# Patient Record
Sex: Male | Born: 2000 | Race: Black or African American | Hispanic: No | Marital: Single | Smoking: Never smoker
Health system: Southern US, Community
[De-identification: ages and names within clinical notes are randomized; demographics above are authoritative.]

## PROBLEM LIST (undated history)

## (undated) HISTORY — PX: FRENULOPLASTY: SHX1684

## (undated) HISTORY — PX: TONSILLECTOMY AND ADENOIDECTOMY: SUR1326

---
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
## 2019-04-12 ENCOUNTER — Encounter (HOSPITAL_COMMUNITY): Payer: Self-pay | Admitting: Emergency Medicine

## 2019-04-12 ENCOUNTER — Emergency Department (HOSPITAL_COMMUNITY): Payer: BC Managed Care – PPO

## 2019-04-12 ENCOUNTER — Emergency Department (HOSPITAL_COMMUNITY)
Admission: EM | Admit: 2019-04-12 | Discharge: 2019-04-12 | Disposition: A | Discharge status: Home / Self Care | Payer: BC Managed Care – PPO | Attending: Emergency Medicine | Admitting: Emergency Medicine

## 2019-04-12 ENCOUNTER — Other Ambulatory Visit: Payer: Self-pay

## 2019-04-12 DIAGNOSIS — Y999 Unspecified external cause status: Secondary | ICD-10-CM | POA: Diagnosis not present

## 2019-04-12 DIAGNOSIS — Y9389 Activity, other specified: Secondary | ICD-10-CM | POA: Diagnosis not present

## 2019-04-12 DIAGNOSIS — W268XXA Contact with other sharp object(s), not elsewhere classified, initial encounter: Secondary | ICD-10-CM | POA: Insufficient documentation

## 2019-04-12 DIAGNOSIS — Y9289 Other specified places as the place of occurrence of the external cause: Secondary | ICD-10-CM | POA: Insufficient documentation

## 2019-04-12 DIAGNOSIS — S61217A Laceration without foreign body of left little finger without damage to nail, initial encounter: Secondary | ICD-10-CM

## 2019-04-12 DIAGNOSIS — Z23 Encounter for immunization: Secondary | ICD-10-CM | POA: Insufficient documentation

## 2019-04-12 MED ORDER — LIDOCAINE HCL (PF) 2 % IJ SOLN: 5.0000 mL | Freq: Once | INTRAMUSCULAR | Status: AC

## 2019-04-12 MED ORDER — TETANUS-DIPHTH-ACELL PERTUSSIS 5-2.5-18.5 LF-MCG/0.5 IM SUSP
0.5000 mL | Freq: Once | INTRAMUSCULAR | Status: AC
  Administered 2019-04-12: 0.5 mL via INTRAMUSCULAR
  Filled 2019-04-12: qty 0.5

## 2019-04-12 MED ORDER — IBUPROFEN 600 MG PO TABS: 600.0000 mg | ORAL_TABLET | Freq: Four times a day (QID) | ORAL | 0 refills | Status: AC | PRN

## 2019-04-12 MED ORDER — LIDOCAINE HCL (PF) 2 % IJ SOLN
INTRAMUSCULAR | Status: AC
  Filled 2019-04-12: qty 10

## 2019-04-12 MED ORDER — POVIDONE-IODINE 10 % EX SOLN
CUTANEOUS | Status: DC | PRN
  Filled 2019-04-12 (×2): qty 15

## 2019-04-12 MED ORDER — DOXYCYCLINE HYCLATE 100 MG PO CAPS: 100.0000 mg | ORAL_CAPSULE | Freq: Two times a day (BID) | ORAL | 0 refills | Status: DC

## 2019-04-12 MED ORDER — LIDOCAINE HCL (PF) 2 % IJ SOLN
INTRAMUSCULAR | Status: AC
  Administered 2019-04-12: 5 mL
  Filled 2019-04-12: qty 10

## 2019-04-12 NOTE — Discharge Instructions (Addendum)
Keep your wound clean and dry and covered.  Change the dressing once daily however and inspected to make sure it is not having increased redness, swelling or drainage of pus.  Take the entire course of the antibiotics prescribed.  Call Dr. Roney Mans for a recheck of your injury as outlined above.

## 2019-04-12 NOTE — ED Notes (Signed)
BETADINE SOAK

## 2019-04-12 NOTE — ED Triage Notes (Signed)
Injury to left pinkie finger on Saturday, rates pain 6/10.

## 2019-04-12 NOTE — ED Provider Notes (Signed)
[facility] EMERGENCY DEPARTMENT Provider Note   CSN: [id number] Arrival date & time: 04/12/19  1208     History Chief Complaint  Patient presents with  . Finger Injury    left pinkie    Nicholas Mooney is a 19 y.o. male, right handed, presenting with a laceration to his lift 5th finger occurring 2 days ago when he sliced it on a sharp edge of a metal pole.  He has tried to wash the wound but reports he had too much pain in it to do a good job.  He has kept it covered with a bandage, denies any other treatment.  His last tetanus was given 8 years ago.  He denies numbness in the finger, but has difficulty moving it due to pain.   HPI     History reviewed. No pertinent past medical history.  There are no problems to display for this patient.   History reviewed. No pertinent surgical history.     No family history on file.  Social History   Tobacco Use  . Smoking status: Never Smoker  . Smokeless tobacco: Never Used  Substance Use Topics  . Alcohol use: Never  . Drug use: Yes    Types: Marijuana    Home Medications Prior to Admission medications   Medication Sig Start Date End Date Taking? Authorizing Provider  doxycycline (VIBRAMYCIN) 100 MG capsule Take 1 capsule (100 mg total) by mouth 2 (two) times daily. 04/12/19   Evalee Jefferson, PA-C  ibuprofen (ADVIL) 600 MG tablet Take 1 tablet (600 mg total) by mouth every 6 (six) hours as needed. 04/12/19   Evalee Jefferson, PA-C    Allergies    Augmentin [amoxicillin-pot clavulanate]  Review of Systems   Review of Systems  Constitutional: Negative for chills and fever.  Musculoskeletal: Positive for arthralgias.  Skin: Positive for wound.  Neurological: Negative for weakness and numbness.  All other systems reviewed and are negative.   Physical Exam Updated Vital Signs BP 134/76   Pulse 64   Temp 99.1 F (37.3 C) (Oral)   Resp 20   Ht 6\' 2"  (1.88 m)   Wt 65.8 kg   SpO2 97%   BMI 18.62 kg/m   Physical  Exam Constitutional:      Appearance: He is well-developed.  HENT:     Head: Atraumatic.  Cardiovascular:     Comments: Pulses equal bilaterally Musculoskeletal:        General: Tenderness and signs of injury present.     Left hand: Laceration present. Normal sensation.     Cervical back: Normal range of motion.     Comments: Distal sensation intact.  FROM of mcp and proximal phalanx, dip with fair flexion with reduced ability to completely extend the dip joint.  Irregular laceration with area of devitalized looking skin medial aspect of wound. See photos.   Skin:    General: Skin is warm and dry.  Neurological:     Mental Status: He is alert.     Sensory: No sensory deficit.     Deep Tendon Reflexes: Reflexes normal.         ED Results / Procedures / Treatments   Labs (all labs ordered are listed, but only abnormal results are displayed) Labs Reviewed - No data to display  EKG None  Radiology DG Finger Little Left  Result Date: 04/12/2019 CLINICAL DATA:  Laceration EXAM: LEFT LITTLE FINGER 2+V COMPARISON:  None. FINDINGS: Soft tissue injury of the left fifth digit compatible  with a laceration. No underlying acute osseous finding, fracture or joint abnormality. No radiopaque foreign body. IMPRESSION: Left fifth digit soft tissue injury compatible with laceration. No acute osseous finding. Electronically Signed   By: Judie Petit.  Shick M.D.   On: 04/12/2019 13:25    Procedures Procedures (including critical care time)  NERVE BLOCK Performed by: Burgess Amor Consent: Verbal consent obtained. Required items: required blood products, implants, devices, and special equipment available Time out: Immediately prior to procedure a "time out" was called to verify the correct patient, procedure, equipment, support staff and site/side marked as required.  Indication: pain relief Nerve block body site: left 5th finger  Preparation: Patient was prepped and draped in the usual sterile  fashion. Needle gauge: 25 G Location technique: anatomical landmarks  Local anesthetic: lidocaine 2% no epi  Anesthetic total: 2 ml  Outcome: pain improved Patient tolerance: Patient tolerated the procedure well with no immediate complications.   LACERATION REPAIR Performed by: Burgess Amor Authorized by: Burgess Amor Consent: Verbal consent obtained. Risks and benefits: risks, benefits and alternatives were discussed Consent given by: patient Patient identity confirmed: provided demographic data Prepped and Draped in normal sterile fashion Wound explored  Laceration Location: left 5th finger  Laceration Length: 4cm  No Foreign Bodies seen or palpated  Anesthesia: digital block per above Local anesthetic:per above  Anesthetic total: per above Irrigation method: syringe Amount of cleaning: standard  Skin closure: prolene 4-0  Number of sutures: 6  Technique: simple interrupted - loose sutures to loosely approximate the flap edges  Patient tolerance: Patient tolerated the procedure well with no immediate complications.      Medications Ordered in ED Medications  povidone-iodine (BETADINE) 10 % external solution ( Topical Given 04/12/19 1258)  lidocaine (XYLOCAINE) 2 % injection (has no administration in time range)  Tdap (BOOSTRIX) injection 0.5 mL (0.5 mLs Intramuscular Given 04/12/19 1255)  lidocaine (XYLOCAINE) 2 % injection 5 mL (5 mLs Other Given 04/12/19 1258)    ED Course  I have reviewed the triage vital signs and the nursing notes.  Pertinent labs & imaging results that were available during my care of the patient were reviewed by me and considered in my medical decision making (see chart for details).    MDM Rules/Calculators/A&P                      Pt with 2 day old finger laceration, with some loss of epidermis, flap laceration of questionable vascular integrity. Xray negative for fracture. Discussed with Dr Roney Mans who reviewed photos - recommended  a few tacking sutures, antibiotics and dressing.  He will f/u with pt in his office within 1 week.  Pt to call for appt.  Discussed plan with pt who understands and agrees with plan.  He was started on doxycycline. I did not feel he would be compliant with qid keflex dosing.   Also updated tetanus vaccine.  Pt was seen by Dr Manus Gunning during ed visit.  Final Clinical Impression(s) / ED Diagnoses Final diagnoses:  Laceration of left little finger without foreign body without damage to nail, initial encounter    Rx / DC Orders ED Discharge Orders         Ordered    doxycycline (VIBRAMYCIN) 100 MG capsule  2 times daily     04/12/19 1436    ibuprofen (ADVIL) 600 MG tablet  Every 6 hours PRN     04/12/19 1439  Burgess Amor, PA-C 04/12/19 1716    Glynn Octave, MD 04/12/19 1925

## 2021-02-13 IMAGING — DX DG FINGER LITTLE 2+V*L*
3 series · 3 of 3 positions shown · non-contrast
Comparison: None.

CLINICAL DATA: Laceration

EXAM:
LEFT LITTLE FINGER 2+V

[finger ap]
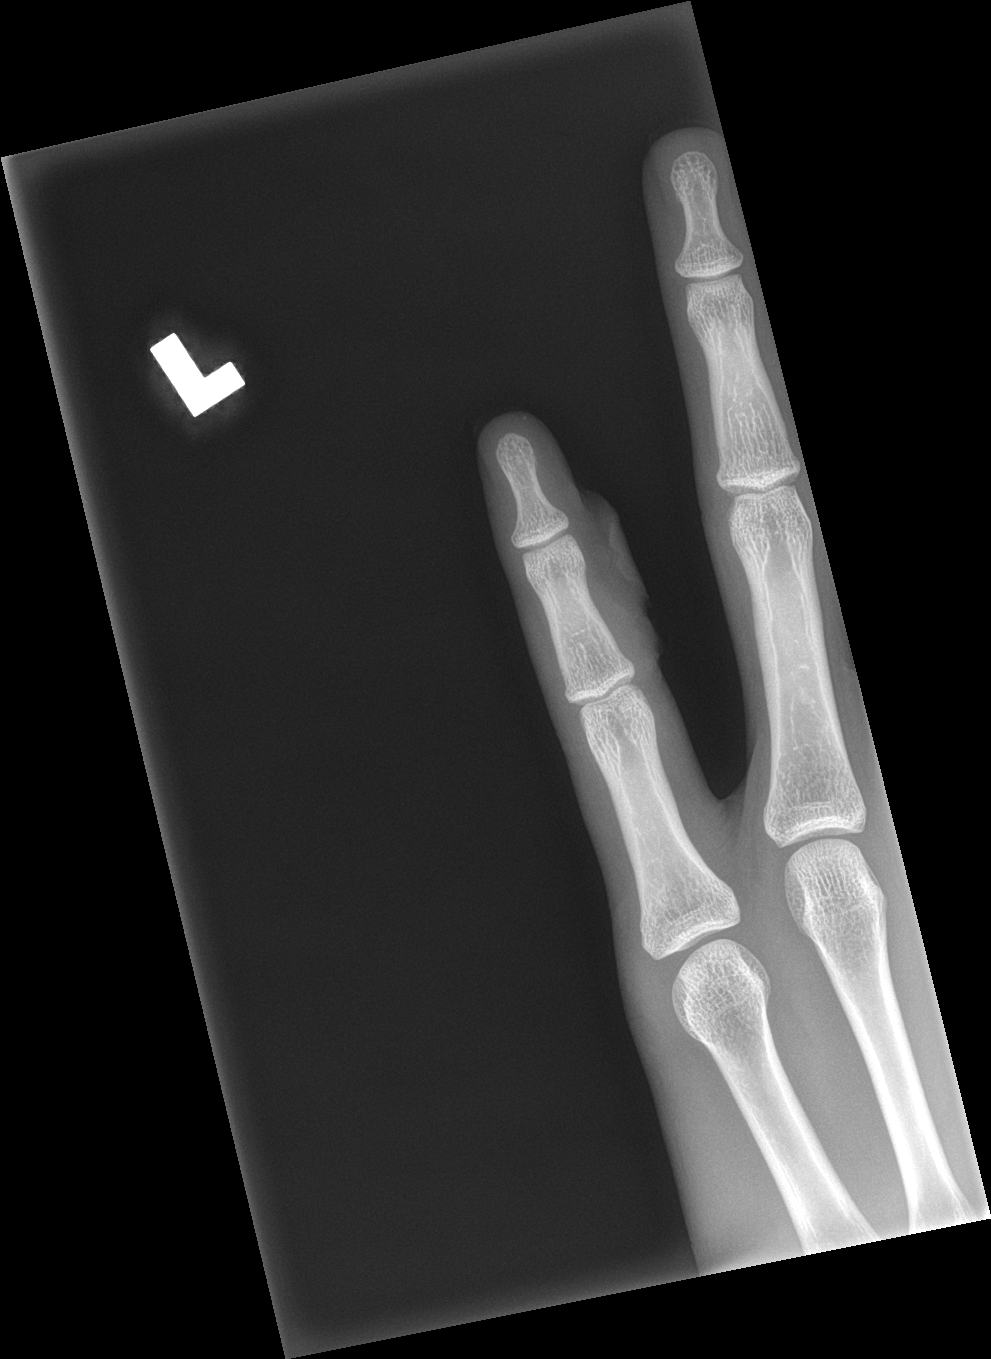

[finger obl]
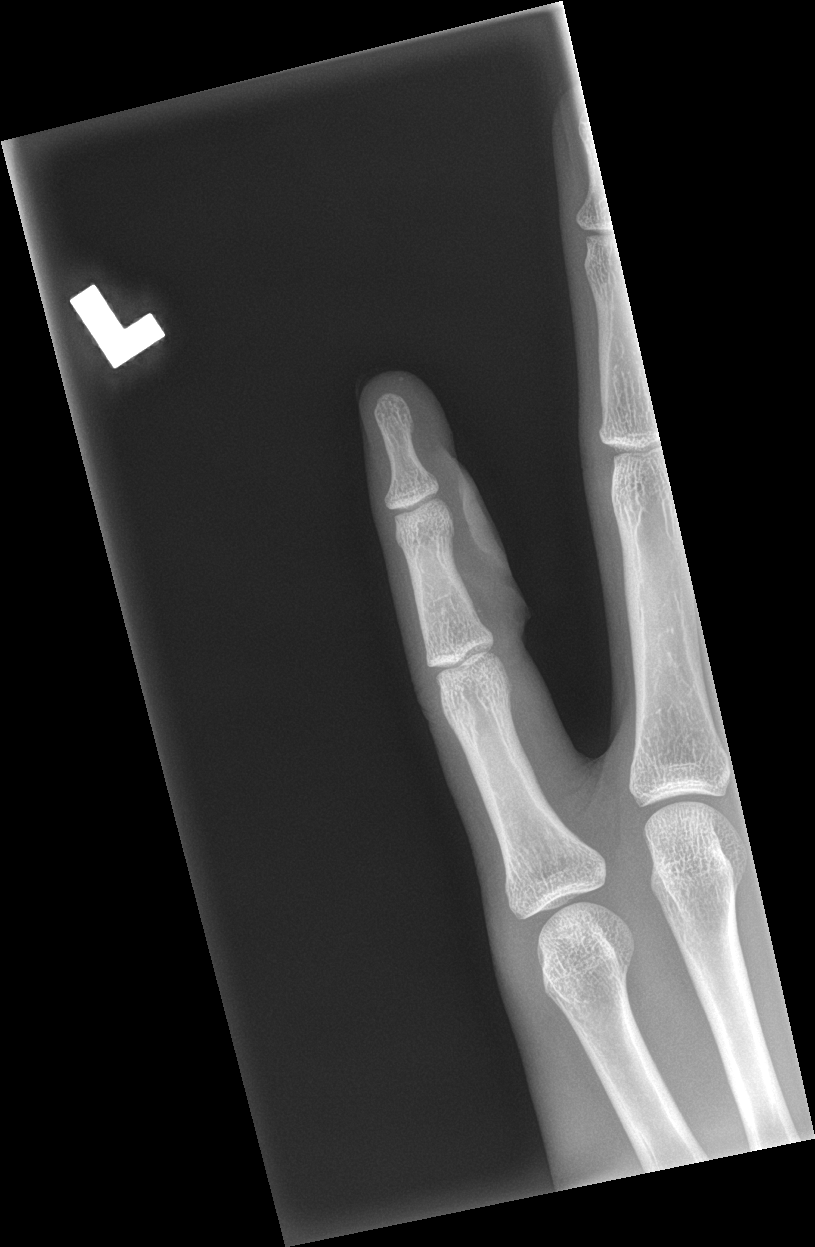

[finger lat]
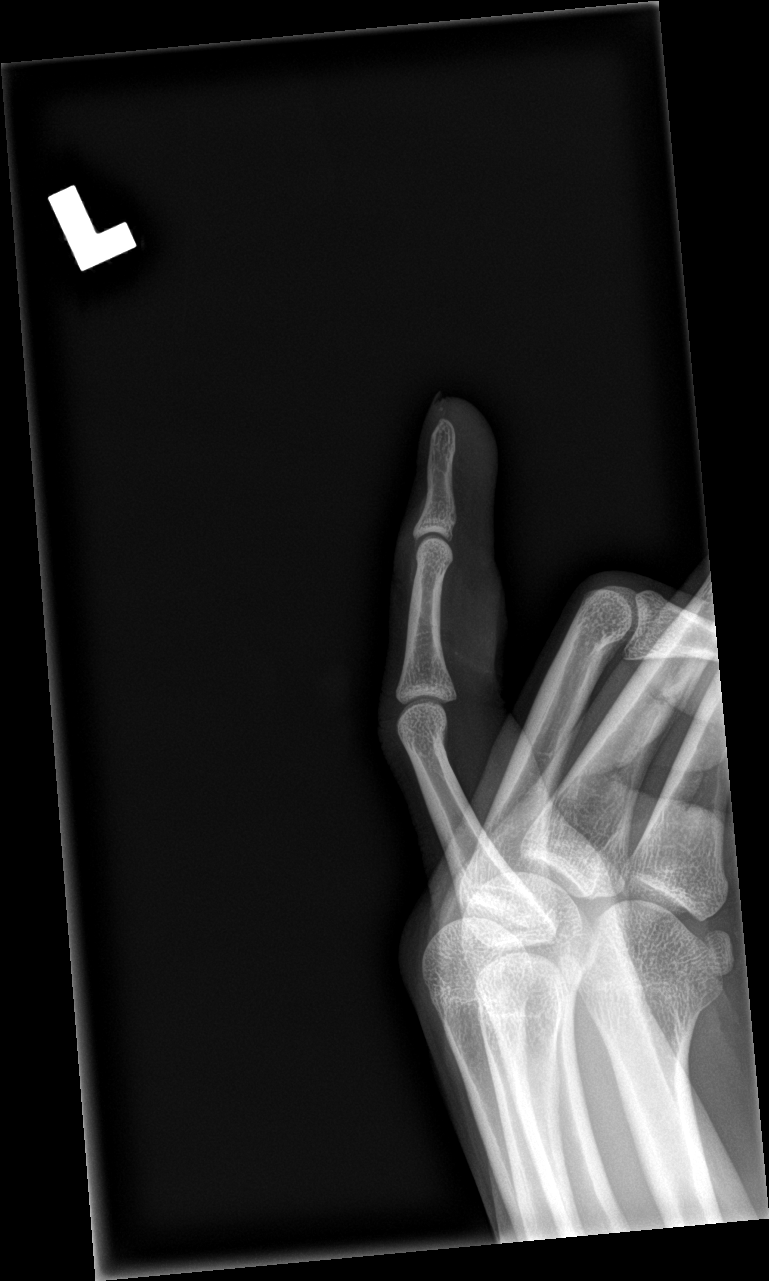

[3 of 3 positions shown; findings below may reference images not displayed]

FINDINGS: Soft tissue injury of the left fifth digit compatible with a
laceration. No underlying acute osseous finding, fracture or joint
abnormality. No radiopaque foreign body.
IMPRESSION: Left fifth digit soft tissue injury compatible with laceration.

No acute osseous finding.

## 2021-09-17 ENCOUNTER — Emergency Department (HOSPITAL_COMMUNITY): Payer: BC Managed Care – PPO

## 2021-09-17 ENCOUNTER — Other Ambulatory Visit: Payer: Self-pay

## 2021-09-17 ENCOUNTER — Encounter (HOSPITAL_COMMUNITY): Payer: Self-pay | Admitting: *Deleted

## 2021-09-17 ENCOUNTER — Emergency Department (HOSPITAL_COMMUNITY)
Admission: EM | Admit: 2021-09-17 | Discharge: 2021-09-18 | Disposition: A | Discharge status: Home / Self Care | Payer: BC Managed Care – PPO | Attending: Emergency Medicine | Admitting: Emergency Medicine

## 2021-09-17 DIAGNOSIS — R002 Palpitations: Secondary | ICD-10-CM | POA: Insufficient documentation

## 2021-09-17 MED ORDER — SODIUM CHLORIDE 0.9 % IV BOLUS
1000.0000 mL | Freq: Once | INTRAVENOUS | Status: AC
  Administered 2021-09-17: 1000 mL via INTRAVENOUS

## 2021-09-17 NOTE — ED Provider Notes (Signed)
[facility] EMERGENCY DEPARTMENT Provider Note   CSN: [id number] Arrival date & time: 09/17/21  2224     History {Add pertinent medical, surgical, social history, OB history to HPI:1} No chief complaint on file.   Nicholas Mooney is a 21 y.o. male.  Patient brought to the emergency department for evaluation of rapid heart rate.  Patient reports that his heart was racing prior to coming to the ER.  No associated chest pain.  Mother reports that she tried to listen to his heart with her stethoscope, it was above 100 but she could not count how fast.  Patient has become very sleepy.  He denies drug use but does report that he had some skittles that "may have been laced".       Home Medications Prior to Admission medications   Medication Sig Start Date End Date Taking? Authorizing Provider  doxycycline (VIBRAMYCIN) 100 MG capsule Take 1 capsule (100 mg total) by mouth 2 (two) times daily. 04/12/19   Burgess Amor, PA-C  ibuprofen (ADVIL) 600 MG tablet Take 1 tablet (600 mg total) by mouth every 6 (six) hours as needed. 04/12/19   Burgess Amor, PA-C      Allergies    Augmentin [amoxicillin-pot clavulanate]    Review of Systems   Review of Systems  Physical Exam Updated Vital Signs BP 112/66   Pulse 90   Temp 97.8 F (36.6 C) (Oral)   Resp 19   Ht 6\' 2"  (1.88 m)   Wt 68 kg   SpO2 97%   BMI 19.26 kg/m  Physical Exam Vitals and nursing note reviewed.  Constitutional:      General: He is not in acute distress.    Appearance: He is well-developed.     Comments: Somnolent  HENT:     Head: Normocephalic and atraumatic.     Mouth/Throat:     Mouth: Mucous membranes are moist.  Eyes:     General: Vision grossly intact. Gaze aligned appropriately.     Extraocular Movements: Extraocular movements intact.     Conjunctiva/sclera: Conjunctivae normal.  Cardiovascular:     Rate and Rhythm: Normal rate and regular rhythm.     Pulses: Normal pulses.     Heart sounds: Normal heart  sounds, S1 normal and S2 normal. No murmur heard.    No friction rub. No gallop.  Pulmonary:     Effort: Pulmonary effort is normal. No respiratory distress.     Breath sounds: Normal breath sounds.  Abdominal:     Palpations: Abdomen is soft.     Tenderness: There is no abdominal tenderness. There is no guarding or rebound.     Hernia: No hernia is present.  Musculoskeletal:        General: No swelling.     Cervical back: Full passive range of motion without pain, normal range of motion and neck supple. No pain with movement, spinous process tenderness or muscular tenderness. Normal range of motion.     Right lower leg: No edema.     Left lower leg: No edema.  Skin:    General: Skin is warm and dry.     Capillary Refill: Capillary refill takes less than 2 seconds.     Findings: No ecchymosis, erythema, lesion or wound.  Neurological:     Mental Status: He is oriented to person, place, and time.     GCS: GCS eye subscore is 4. GCS verbal subscore is 5. GCS motor subscore is 6.  Cranial Nerves: Cranial nerves 2-12 are intact.     Sensory: Sensation is intact.     Motor: Motor function is intact. No weakness or abnormal muscle tone.     Coordination: Coordination is intact.  Psychiatric:        Mood and Affect: Mood normal.        Speech: Speech normal.        Behavior: Behavior normal.     ED Results / Procedures / Treatments   Labs (all labs ordered are listed, but only abnormal results are displayed) Labs Reviewed  BASIC METABOLIC PANEL  CBC  RAPID URINE DRUG SCREEN, HOSP PERFORMED  TROPONIN I (HIGH SENSITIVITY)    EKG None  Radiology No results found.  Procedures Procedures  {Document cardiac monitor, telemetry assessment procedure when appropriate:1}  Medications Ordered in ED Medications - No data to display  ED Course/ Medical Decision Making/ A&P                           Medical Decision Making Amount and/or Complexity of Data Reviewed Labs:  ordered. Radiology: ordered.   ***  {Document critical care time when appropriate:1} {Document review of labs and clinical decision tools ie heart score, Chads2Vasc2 etc:1}  {Document your independent review of radiology images, and any outside records:1} {Document your discussion with family members, caretakers, and with consultants:1} {Document social determinants of health affecting pt's care:1} {Document your decision making why or why not admission, treatments were needed:1} Final Clinical Impression(s) / ED Diagnoses Final diagnoses:  None    Rx / DC Orders ED Discharge Orders     None

## 2021-09-17 NOTE — ED Triage Notes (Signed)
Pt with rapid heart rate for past 30 minutes.  Pt denies using any drugs or ETOH. C/o weakness. Pt drowsy in triage.

## 2021-09-18 ENCOUNTER — Emergency Department (HOSPITAL_COMMUNITY): Payer: BC Managed Care – PPO

## 2021-09-18 LAB — BASIC METABOLIC PANEL
Anion gap: 7 (ref 5–15)
BUN: 25 mg/dL — ABNORMAL HIGH (ref 6–20)
CO2: 27 mmol/L (ref 22–32)
Calcium: 9.4 mg/dL (ref 8.9–10.3)
Chloride: 103 mmol/L (ref 98–111)
Creatinine, Ser: 1.33 mg/dL — ABNORMAL HIGH (ref 0.61–1.24)
GFR, Estimated: >60 mL/min (ref >60)
Glucose, Bld: 146 mg/dL — ABNORMAL HIGH (ref 70–99)
Potassium: 3.5 mmol/L (ref 3.5–5.1)
Sodium: 137 mmol/L (ref 135–145)

## 2021-09-18 LAB — RAPID URINE DRUG SCREEN, HOSP PERFORMED
Amphetamines: NOT DETECTED
Barbiturates: NOT DETECTED
Benzodiazepines: NOT DETECTED
Cocaine: NOT DETECTED
Opiates: NOT DETECTED
Tetrahydrocannabinol: POSITIVE — AB

## 2021-09-18 LAB — CBC
HCT: 40.1 % (ref 39.0–52.0)
Hemoglobin: 13.7 g/dL (ref 13.0–17.0)
MCH: 31.6 pg (ref 26.0–34.0)
MCHC: 34.2 g/dL (ref 30.0–36.0)
MCV: 92.4 fL (ref 80.0–100.0)
Platelets: 280 10*3/uL (ref 150–400)
RBC: 4.34 MIL/uL (ref 4.22–5.81)
RDW: 11.6 % (ref 11.5–15.5)
WBC: 9.2 10*3/uL (ref 4.0–10.5)
nRBC: 0 % (ref 0.0–0.2)

## 2021-09-18 LAB — TROPONIN I (HIGH SENSITIVITY)
Troponin I (High Sensitivity): 32 ng/L — ABNORMAL HIGH (ref <18)
Troponin I (High Sensitivity): 50 ng/L — ABNORMAL HIGH (ref <18)

## 2021-09-18 MED ORDER — IOHEXOL 350 MG/ML SOLN
100.0000 mL | Freq: Once | INTRAVENOUS | Status: AC | PRN
  Administered 2021-09-18: 100 mL via INTRAVENOUS

## 2021-09-18 NOTE — ED Notes (Signed)
Patient transported to CT 

## 2021-09-21 ENCOUNTER — Encounter: Payer: Self-pay | Admitting: Cardiology

## 2021-09-21 ENCOUNTER — Ambulatory Visit: Payer: BC Managed Care – PPO | Admitting: Cardiology

## 2021-09-21 VITALS — BP 124/79 | HR 54 | Temp 98.7°F | Resp 17 | Ht 74.0 in | Wt 137.0 lb

## 2021-09-21 DIAGNOSIS — R002 Palpitations: Secondary | ICD-10-CM

## 2021-09-21 DIAGNOSIS — F1210 Cannabis abuse, uncomplicated: Secondary | ICD-10-CM

## 2021-09-21 DIAGNOSIS — R778 Other specified abnormalities of plasma proteins: Secondary | ICD-10-CM

## 2021-09-21 NOTE — Progress Notes (Signed)
ID:  Trellis Moment, DOB 2000-04-04, MRN [id number]  PCP:  Patient, No Pcp Per  Cardiologist:  Tessa Lerner, DO, [facility] (established care 09/21/2021)  REASON FOR CONSULT: Elevated troponin/palpitations  REQUESTING PHYSICIAN:  Dr. Louanna Raw emergency room department  Chief Complaint  Patient presents with   Follow-up    ED follow-up.  Elevated troponin   Palpitations    HPI  Nicholas Mooney is a 21 y.o. African-American male who presents to the clinic for evaluation of elevated troponins/palpitations at the request of Dr. Jaci Carrel.   Palpitations: Patient has had episodes of palpitations intermittently and usually short-lived and self-limited.  However, this past Monday on September 17, 2021 he woke up from sleep at 9:30 PM with palpitations that are more intense and frequent.  Since his symptoms did not resolve spontaneously he reached out to his mom who stopped by and evaluated the patient.  According to the patient's mother his heart rate was high but not able to quantify the pulse.  He went to the ED for further evaluation and management.  By the time he was triaged his pulse was 103 bpm and by the time he was discharged it was 80 bpm.  He did not have any associated lightheadedness, dizziness, shortness of breath, syncope.  Patient does not consume any significant amount of caffeinated beverages, sodas, energy drinks.  However he does recreationally smoke marijuana.  Prior to this episode he had an edible [facility]) which may have been laced as the patient was quite lethargic when mom found him at home and also in triage.  Intermittently patient also complains of chest tightness, occurs once a month, nonexertional, does not resolve with resting, usually self-limited.  When he had gone to the ED he had a slight elevation of high-sensitivity troponin initially 32 followed by 50.  His urine drug screen was positive for THC.  CT PE protocol negative for pulmonary embolism per  report.  Renal insufficiency with a serum creatinine of 1.33 mg/dL.  In high school patient was an avid and playing basketball, football, and running track.  He is never experienced exertional syncope.  No family history of premature coronary artery disease, sudden cardiac death, or cardiomyopathy.  ALLERGIES: Allergies  Allergen Reactions   Augmentin [Amoxicillin-Pot Clavulanate]     MEDICATION LIST PRIOR TO VISIT: Current Meds  Medication Sig   ibuprofen (ADVIL) 600 MG tablet Take 1 tablet (600 mg total) by mouth every 6 (six) hours as needed.     PAST MEDICAL HISTORY: History reviewed. No pertinent past medical history.  PAST SURGICAL HISTORY: Past Surgical History:  Procedure Laterality Date   FRENULOPLASTY     TONSILLECTOMY AND ADENOIDECTOMY      FAMILY HISTORY: The patient family history includes Hypercholesterolemia in his father.  SOCIAL HISTORY:  The patient  reports that he has never smoked. He has never used smokeless tobacco. He reports current alcohol use. He reports current drug use. Drug: Marijuana.  REVIEW OF SYSTEMS: Review of Systems  Cardiovascular:  Positive for palpitations. Negative for chest pain, claudication, dyspnea on exertion, irregular heartbeat, leg swelling, near-syncope, orthopnea, paroxysmal nocturnal dyspnea and syncope.  Respiratory:  Negative for shortness of breath.   Hematologic/Lymphatic: Negative for bleeding problem.  Musculoskeletal:  Negative for muscle cramps and myalgias.  Neurological:  Negative for dizziness and light-headedness.    PHYSICAL EXAM:    09/21/2021    8:56 AM 09/18/2021    4:10 AM 09/18/2021    3:30 AM  Vitals  with BMI  Height 6\' 2"     Weight 137 lbs    BMI 17.58    Systolic 124 111  Diastolic 79 63 58  Pulse 54 64 64    CONSTITUTIONAL: Well-developed and well-nourished. No acute distress.  SKIN: Skin is warm and dry. No rash noted. No cyanosis. No pallor. No jaundice HEAD: Normocephalic and  atraumatic.  EYES: No scleral icterus MOUTH/THROAT: Moist oral membranes.  NECK: No JVD present. No thyromegaly noted. No carotid bruits  CHEST Normal respiratory effort. No intercostal retractions  LUNGS: Clear to auscultation bilaterally.  No stridor. No wheezes. No rales.  CARDIOVASCULAR: Regular rate and rhythm, positive S1-S2, no murmurs rubs or gallops appreciated. ABDOMINAL: Soft, nontender, nondistended, positive bowel sounds in all 4 quadrants, no apparent ascites.  EXTREMITIES: No peripheral edema, warm to touch, 2+ bilateral DP and PT pulses HEMATOLOGIC: No significant bruising NEUROLOGIC: Oriented to person, place, and time. Nonfocal. Normal muscle tone.  PSYCHIATRIC: Normal mood and affect. Normal behavior. Cooperative  CARDIAC DATABASE: EKG: 09/17/2021: Sinus tachycardia, 109 bpm, nonspecific T wave abnormality, consider old inferior infarct.   09/18/2021: Normal sinus rhythm, 66 bpm, ST elevations likely secondary to early repolarization abnormality.    09/21/2021: Sinus bradycardia, 53 bpm, without underlying ischemia injury pattern.  Echocardiogram: No results found for this or any previous visit from the past 1095 days.    Stress Testing: No results found for this or any previous visit from the past 1095 days.   Heart Catheterization: None  LABORATORY DATA:    Latest Ref Rng & Units 09/17/2021   11:37 PM  CBC  WBC 4.0 - 10.5 K/uL 9.2   Hemoglobin 13.0 - 17.0 g/dL 09/19/2021   Hematocrit 49.6 - 52.0 % 40.1   Platelets 150 - 400 K/uL 280        Latest Ref Rng & Units 09/17/2021   11:37 PM  CMP  Glucose 70 - 99 mg/dL 09/19/2021   BUN 6 - 20 mg/dL 25   Creatinine 163 - 1.24 mg/dL 8.46   Sodium 6.59 - 935 mmol/L 137   Potassium 3.5 - 5.1 mmol/L 3.5   Chloride 98 - 111 mmol/L 103   CO2 22 - 32 mmol/L 27   Calcium 8.9 - 10.3 mg/dL 9.4     Lipid Panel  No results found for: "CHOL", "TRIG", "HDL", "CHOLHDL", "VLDL", "LDLCALC", "LDLDIRECT", "LABVLDL"  No components  found for: "NTPROBNP" No results for input(s): "PROBNP" in the last 8760 hours. No results for input(s): "TSH" in the last 8760 hours.  BMP Recent Labs    09/17/21 2337  NA 137  K 3.5  CL 103  CO2 27  GLUCOSE 146*  BUN 25*  CREATININE 1.33*  CALCIUM 9.4  GFRNONAA >60    HEMOGLOBIN A1C No results found for: "HGBA1C", "MPG"  IMPRESSION:    ICD-10-CM   1. Palpitations  R00.2 EKG 12-Lead    2. Elevated troponin I level  R77.8 PCV ECHOCARDIOGRAM COMPLETE    PCV CARDIAC STRESS TEST    3. Marijuana abuse  F12.10        RECOMMENDATIONS: Nicholas Mooney is a 21 y.o. African-American male whose past medical history and cardiac risk factors include: Marijuana use.  Patient recently had a ED visit for palpitations that were more prolonged and intense compared to his baseline.  Palpitations/tachycardia resolved over several hours and he did not need any significant intervention during his recent hospitalization.  Labs noted elevated troponins with no significant rise or fall to  suggest ACS.  During his ER visit cardiology was consulted for further recommendations.  I recommended either overnight stay under observation or be seen as outpatient.  Patient and mother decided to follow-up as outpatient.  He is not having recurrences of palpitations.  His recent episode of palpitations that led to the ED likely secondary to consuming [facility] edibles which may have been laced (per patient and his mother).  EKG today shows a sinus rhythm with early repolarization abnormality (normal variant).  He does not have precordial pain to suggest angina pectoris.  Reassurance was provided to both the patient and his mother at today's office visit.  However due to the troponin leak they would feel comfortable with additional work-up.  Recommended exercise treadmill stress test and surface echocardiogram.  As long as the results of the echo and GXT are favorable no additional work-up is warranted.  Patient  is educated on importance of complete cessation of smoking marijuana and consuming edibles.  Patient provides verbal consent to discuss the test results w/ his mom when available.  Will see the patient on as-needed basis going forward or sooner if needed.   FINAL MEDICATION LIST END OF ENCOUNTER: No orders of the defined types were placed in this encounter.   There are no discontinued medications.   Current Outpatient Medications:    ibuprofen (ADVIL) 600 MG tablet, Take 1 tablet (600 mg total) by mouth every 6 (six) hours as needed., Disp: 30 tablet, Rfl: 0   doxycycline (VIBRAMYCIN) 100 MG capsule, Take 1 capsule (100 mg total) by mouth 2 (two) times daily., Disp: 20 capsule, Rfl: 0  Orders Placed This Encounter  Procedures   PCV CARDIAC STRESS TEST   EKG 12-Lead   PCV ECHOCARDIOGRAM COMPLETE    There are no Patient Instructions on file for this visit.   --Continue cardiac medications as reconciled in final medication list. --Return if symptoms worsen or fail to improve. or sooner if needed. --Continue follow-up with your primary care physician regarding the management of your other chronic comorbid conditions.  Patient's questions and concerns were addressed to his satisfaction. He voices understanding of the instructions provided during this encounter.   This note was created using a voice recognition software as a result there may be grammatical errors inadvertently enclosed that do not reflect the nature of this encounter. Every attempt is made to correct such errors.  Tessa Lerner, [place], [facility]  Pager: [phone] Office: [phone]

## 2021-11-02 ENCOUNTER — Ambulatory Visit: Payer: BC Managed Care – PPO

## 2021-11-02 DIAGNOSIS — R778 Other specified abnormalities of plasma proteins: Secondary | ICD-10-CM

## 2021-11-23 NOTE — Progress Notes (Signed)
Tried calling patient no answer left a vm

## 2021-11-23 NOTE — Progress Notes (Signed)
Called pt no answer left a vm

## 2021-11-26 NOTE — Progress Notes (Signed)
Spoke with Patients mother and informed her of Patients results. Mother gave verbal understanding

## 2023-10-07 ENCOUNTER — Emergency Department (HOSPITAL_COMMUNITY)

## 2023-10-07 ENCOUNTER — Emergency Department (HOSPITAL_COMMUNITY)
Admission: EM | Admit: 2023-10-07 | Discharge: 2023-10-07 | Disposition: A | Discharge status: Home / Self Care | Attending: Emergency Medicine | Admitting: Emergency Medicine

## 2023-10-07 ENCOUNTER — Encounter (HOSPITAL_COMMUNITY): Payer: Self-pay

## 2023-10-07 ENCOUNTER — Other Ambulatory Visit: Payer: Self-pay

## 2023-10-07 DIAGNOSIS — X501XXA Overexertion from prolonged static or awkward postures, initial encounter: Secondary | ICD-10-CM | POA: Diagnosis not present

## 2023-10-07 DIAGNOSIS — Y93B9 Activity, other involving muscle strengthening exercises: Secondary | ICD-10-CM | POA: Insufficient documentation

## 2023-10-07 DIAGNOSIS — M545 Low back pain, unspecified: Secondary | ICD-10-CM | POA: Diagnosis present

## 2023-10-07 DIAGNOSIS — S39012A Strain of muscle, fascia and tendon of lower back, initial encounter: Secondary | ICD-10-CM | POA: Insufficient documentation

## 2023-10-07 MED ORDER — LIDOCAINE 5 % EX PTCH
1.0000 | MEDICATED_PATCH | CUTANEOUS | Status: DC
  Administered 2023-10-07: 1 via TRANSDERMAL
  Filled 2023-10-07: qty 1

## 2023-10-07 MED ORDER — METHOCARBAMOL 500 MG PO TABS
1000.0000 mg | ORAL_TABLET | Freq: Once | ORAL | Status: AC
  Administered 2023-10-07: 1000 mg via ORAL
  Filled 2023-10-07: qty 2

## 2023-10-07 MED ORDER — METHOCARBAMOL 500 MG PO TABS: 1000.0000 mg | ORAL_TABLET | Freq: Three times a day (TID) | ORAL | 0 refills | Status: AC | PRN

## 2023-10-07 MED ORDER — MELOXICAM 15 MG PO TABS: 15.0000 mg | ORAL_TABLET | Freq: Every day | ORAL | 0 refills | Status: AC

## 2023-10-07 MED ORDER — LIDOCAINE 5 % EX PTCH: 1.0000 | MEDICATED_PATCH | CUTANEOUS | 0 refills | Status: AC

## 2023-10-07 MED ORDER — KETOROLAC TROMETHAMINE 60 MG/2ML IM SOLN
30.0000 mg | Freq: Once | INTRAMUSCULAR | Status: AC
  Administered 2023-10-07: 30 mg via INTRAMUSCULAR
  Filled 2023-10-07: qty 2

## 2023-10-07 NOTE — ED Provider Notes (Addendum)
 [place] [facility]ERGENCY DEPARTMENT AT [facility] Provider Note   CSN: [id number] Arrival date & time: 10/07/23  1446     Patient presents with: Back Pain   Nicholas Mooney is a 23 y.o. male.    Back Pain Patient presents for back pain.  He has no known chronic medical conditions.  Earlier today, while doing hack squats, he had a sudden onset of pain in his lower back.  Pain is in proximity of L4.  It does not radiate.  Pain is worsened with movements.  He denies any weakness or numbness.     Prior to Admission medications   Medication Sig Start Date End Date Taking? Authorizing Provider  lidocaine  (LIDODERM ) 5 % Place 1 patch onto the skin daily. Remove & Discard patch within 12 hours or as directed by MD 10/07/23  Yes Melvenia Motto, MD  meloxicam  (MOBIC ) 15 MG tablet Take 1 tablet (15 mg total) by mouth daily for 7 days. 10/07/23 10/14/23 Yes Melvenia Motto, MD  methocarbamol  (ROBAXIN ) 500 MG tablet Take 2 tablets (1,000 mg total) by mouth every 8 (eight) hours as needed. 10/07/23  Yes Melvenia Motto, MD  ibuprofen  (ADVIL ) 600 MG tablet Take 1 tablet (600 mg total) by mouth every 6 (six) hours as needed. 04/12/19   Idol, Julie, PA-C    Allergies: Augmentin [amoxicillin-pot clavulanate]    Review of Systems  Musculoskeletal:  Positive for back pain.  All other systems reviewed and are negative.   Updated Vital Signs BP (!) 122/56 (BP Location: Left Arm)   Pulse 67   Temp 98.5 F (36.9 C) (Oral)   Resp 18   Ht 6' 2 (1.88 m)   Wt 62.1 kg   SpO2 97%   BMI 17.59 kg/m   Physical Exam Vitals and nursing note reviewed.  Constitutional:      General: He is not in acute distress.    Appearance: Normal appearance. He is well-developed. He is not ill-appearing, toxic-appearing or diaphoretic.  HENT:     Head: Normocephalic and atraumatic.     Right Ear: External ear normal.     Left Ear: External ear normal.     Nose: Nose normal.     Mouth/Throat:     Mouth: Mucous membranes are  moist.  Eyes:     Conjunctiva/sclera: Conjunctivae normal.  Cardiovascular:     Rate and Rhythm: Normal rate and regular rhythm.  Pulmonary:     Effort: Pulmonary effort is normal. No respiratory distress.     Breath sounds: Normal breath sounds.  Abdominal:     General: There is no distension.     Palpations: Abdomen is soft.     Tenderness: There is no abdominal tenderness.  Musculoskeletal:        General: No swelling, tenderness or deformity.     Cervical back: Normal range of motion and neck supple.  Skin:    General: Skin is warm and dry.     Capillary Refill: Capillary refill takes less than 2 seconds.     Coloration: Skin is not jaundiced or pale.  Neurological:     General: No focal deficit present.     Mental Status: He is alert and oriented to person, place, and time.     Sensory: No sensory deficit.     Motor: No weakness.  Psychiatric:        Mood and Affect: Mood normal.        Behavior: Behavior normal.     (all  labs ordered are listed, but only abnormal results are displayed) Labs Reviewed - No data to display  EKG: None  Radiology: DG Lumbar Spine Complete Result Date: 10/07/2023 CLINICAL DATA:  Injury lifting weights.  Felt pop in back. EXAM: LUMBAR SPINE - COMPLETE 4+ VIEW COMPARISON:  None Available. FINDINGS: There is no evidence of lumbar spine fracture. Alignment is normal. Intervertebral disc spaces are maintained. IMPRESSION: Negative. Electronically Signed   By: Franky Crease M.D.   On: 10/07/2023 15:25     Procedures   Medications Ordered in the ED  lidocaine  (LIDODERM ) 5 % 1 patch (has no administration in time range)  methocarbamol  (ROBAXIN ) tablet 1,000 mg (has no administration in time range)  ketorolac  (TORADOL ) injection 30 mg (has no administration in time range)                                    Medical Decision Making Amount and/or Complexity of Data Reviewed Radiology: ordered.  Risk Prescription drug  management.   Patient presenting for low back pain.  Acute onset was today while doing hack squats.  On arrival in the ED, vital signs are normal.  Patient is well-appearing on exam.  He has no deformities on exam.  He has no significant tenderness to his lower back.  He does have worsened pain with shifting movements.  Straight leg test raise was negative bilaterally.  He has no focal deficits.  Multimodal pain control was ordered and prescribed.  On reassessment, pain was improved.  He is able to stand and ambulate.  He was discharged in good condition.     Final diagnoses:  Strain of lumbar region, initial encounter    ED Discharge Orders          Ordered    meloxicam  (MOBIC ) 15 MG tablet  Daily        10/07/23 1645    lidocaine  (LIDODERM ) 5 %  Every 24 hours        10/07/23 1645    methocarbamol  (ROBAXIN ) 500 MG tablet  Every 8 hours PRN        10/07/23 1645               Melvenia Motto, MD 10/07/23 1645    Melvenia Motto, MD 10/07/23 1745

## 2023-10-07 NOTE — Discharge Instructions (Signed)
 Pain medications were sent to your pharmacy: - Lidocaine  patches can be switched daily. -Meloxicam  is an NSAID medication.  Take this in place of over-the-counter NSAIDs (ibuprofen , naproxen, [employer] powders, etc.) -Methocarbamol  as a muscle relaxer.  Take only as needed.  Gentle movements are good.  Your symptoms should improve with time.  Return to the ED for any new or worsening symptoms of concern.

## 2023-10-07 NOTE — ED Triage Notes (Signed)
 Patient was using the squatting machine at gym and felt a pop in his lower back.  Reports happened approx 2 hrs ago.  Denies numbness or tingling down legs.
